# Patient Record
Sex: Female | Born: 1950 | Hispanic: No
Health system: Northeastern US, Academic
[De-identification: ages and names within clinical notes are randomized; demographics above are authoritative.]

---

## 2011-07-27 IMAGING — CR Chest PA and Left Lateral
2 series · 2 of 2 positions shown · non-contrast
Comparison: 09/30/2010

Final Report
TECHNIQUE: PA and lateral views of the chest are submitted.

[PA]
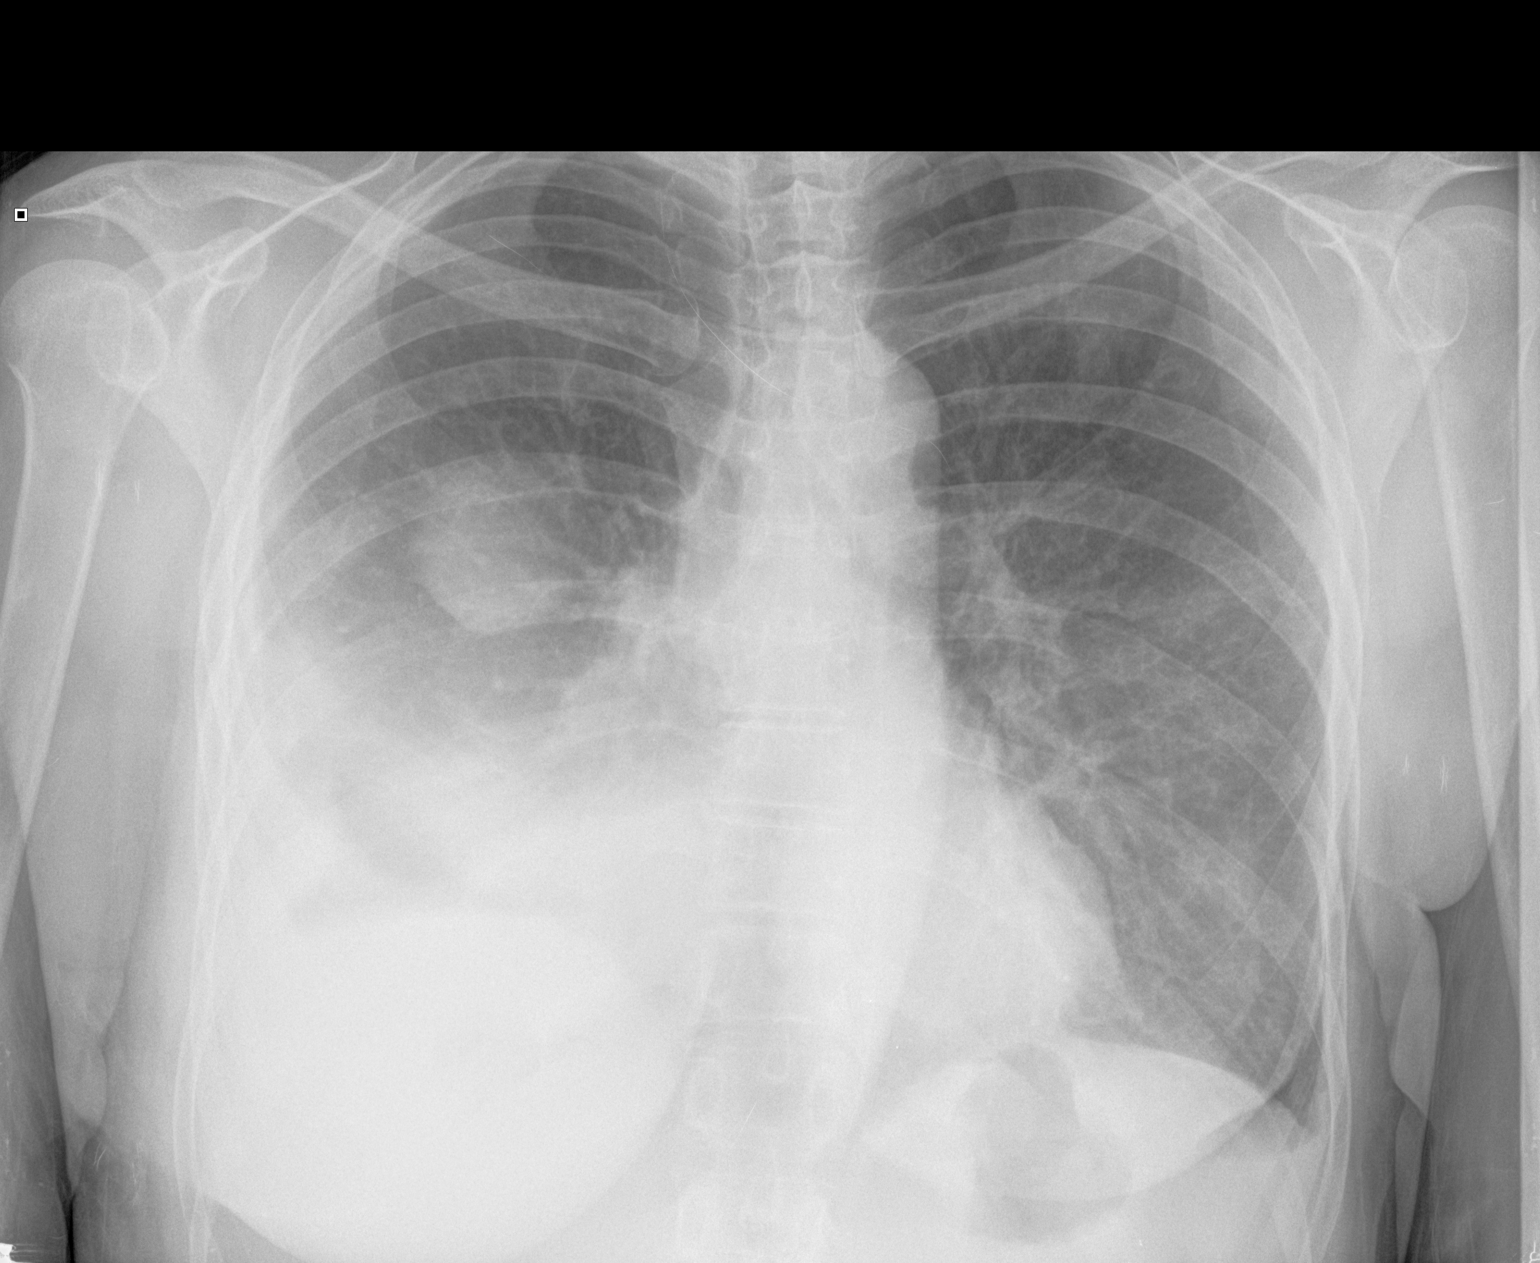

[left lateral]
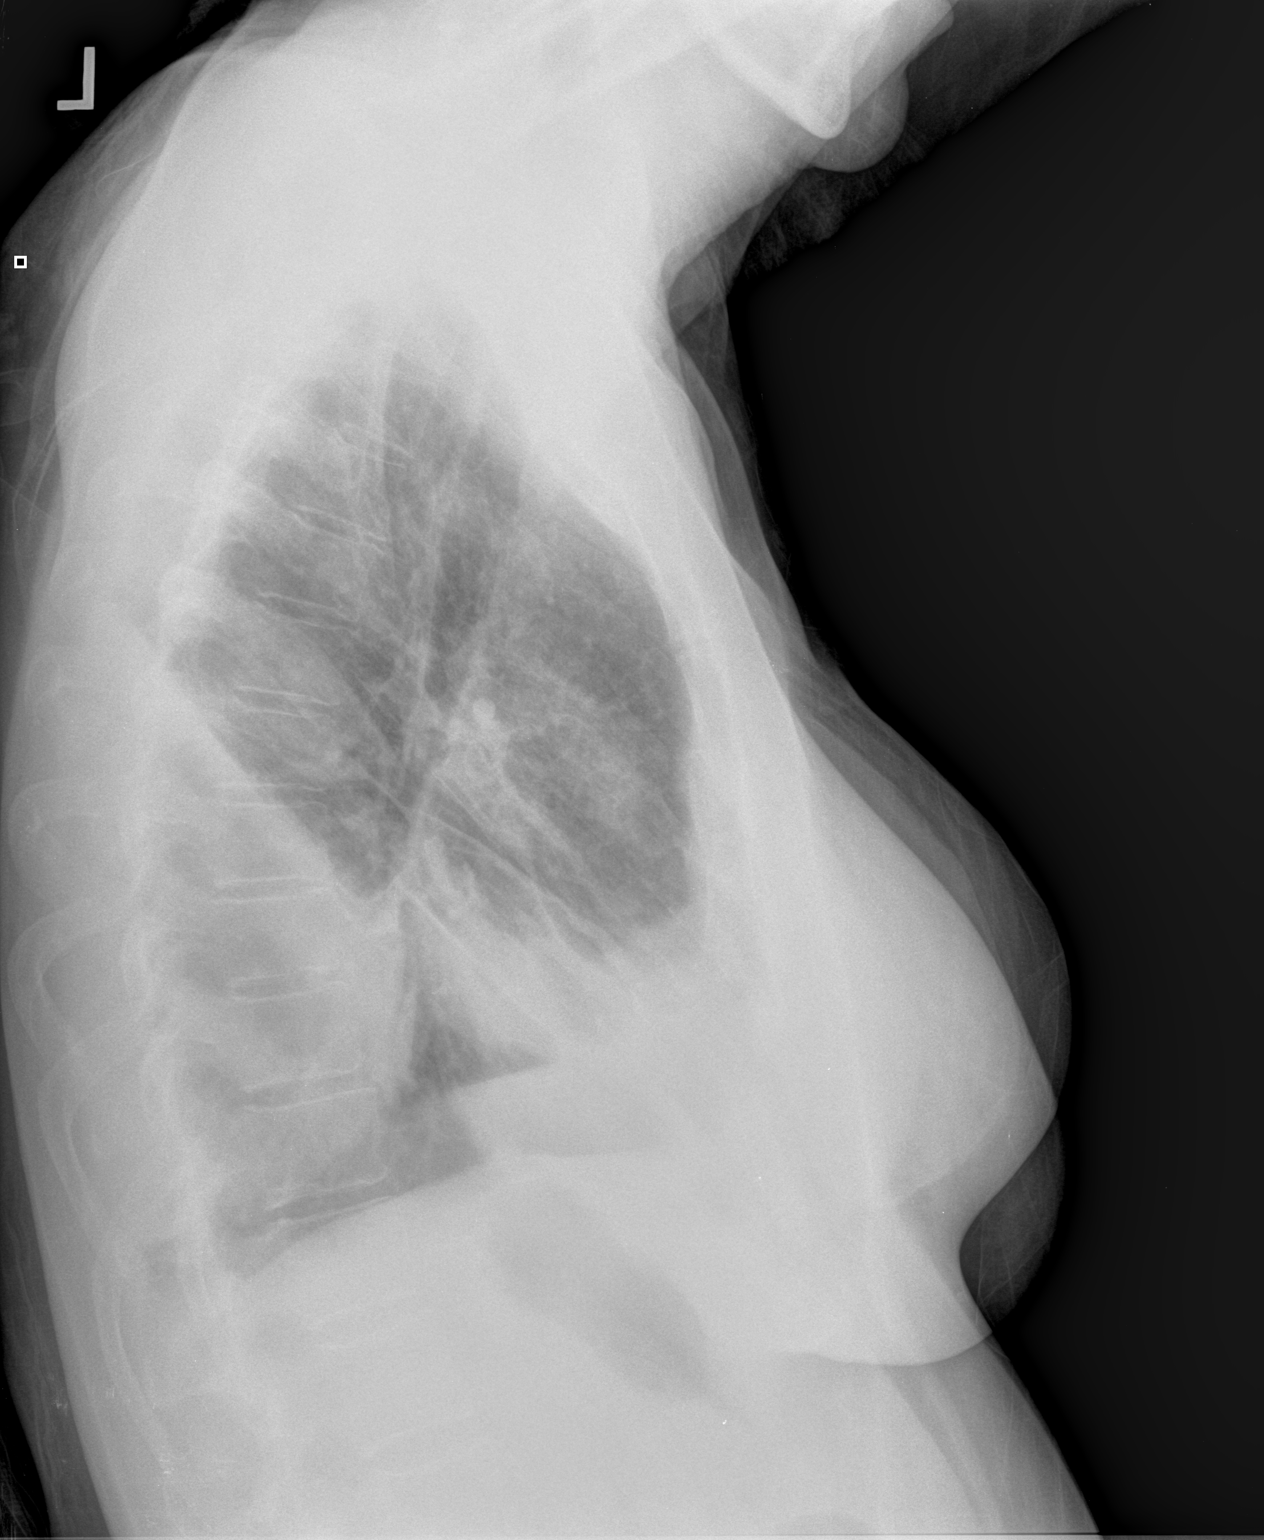

[2 of 2 positions shown; findings below may reference images not displayed]

FINDINGS: The heart is normal in size.  The mediastinum and hilar structures 

are unremarkable.  There is persistent moderate right pleural 

effusion.  There is decreased size of an apparent mass in the mid to 

upper right lung which has been demonstrated on CT examination to 

correspond to loculated fluid within the major fissure.  There is no 

evidence of left pleural effusion.  There is atelectasis at the right 

lung base.  There is no other consolidations seen elsewhere.  There 

is no pneumothorax.
IMPRESSION: Right pleural effusion.  Decreasing size of loculated pleural fluid 

collection the major fissure.  Right basal atelectasis.  No 

pneumothorax.

## 2011-07-28 IMAGING — CR Chest Single PA view
1 series · 1 of 1 positions shown · non-contrast
Comparison: INPUT

Final Report
HISTORY: Visit reason:  Post op;

[AP]
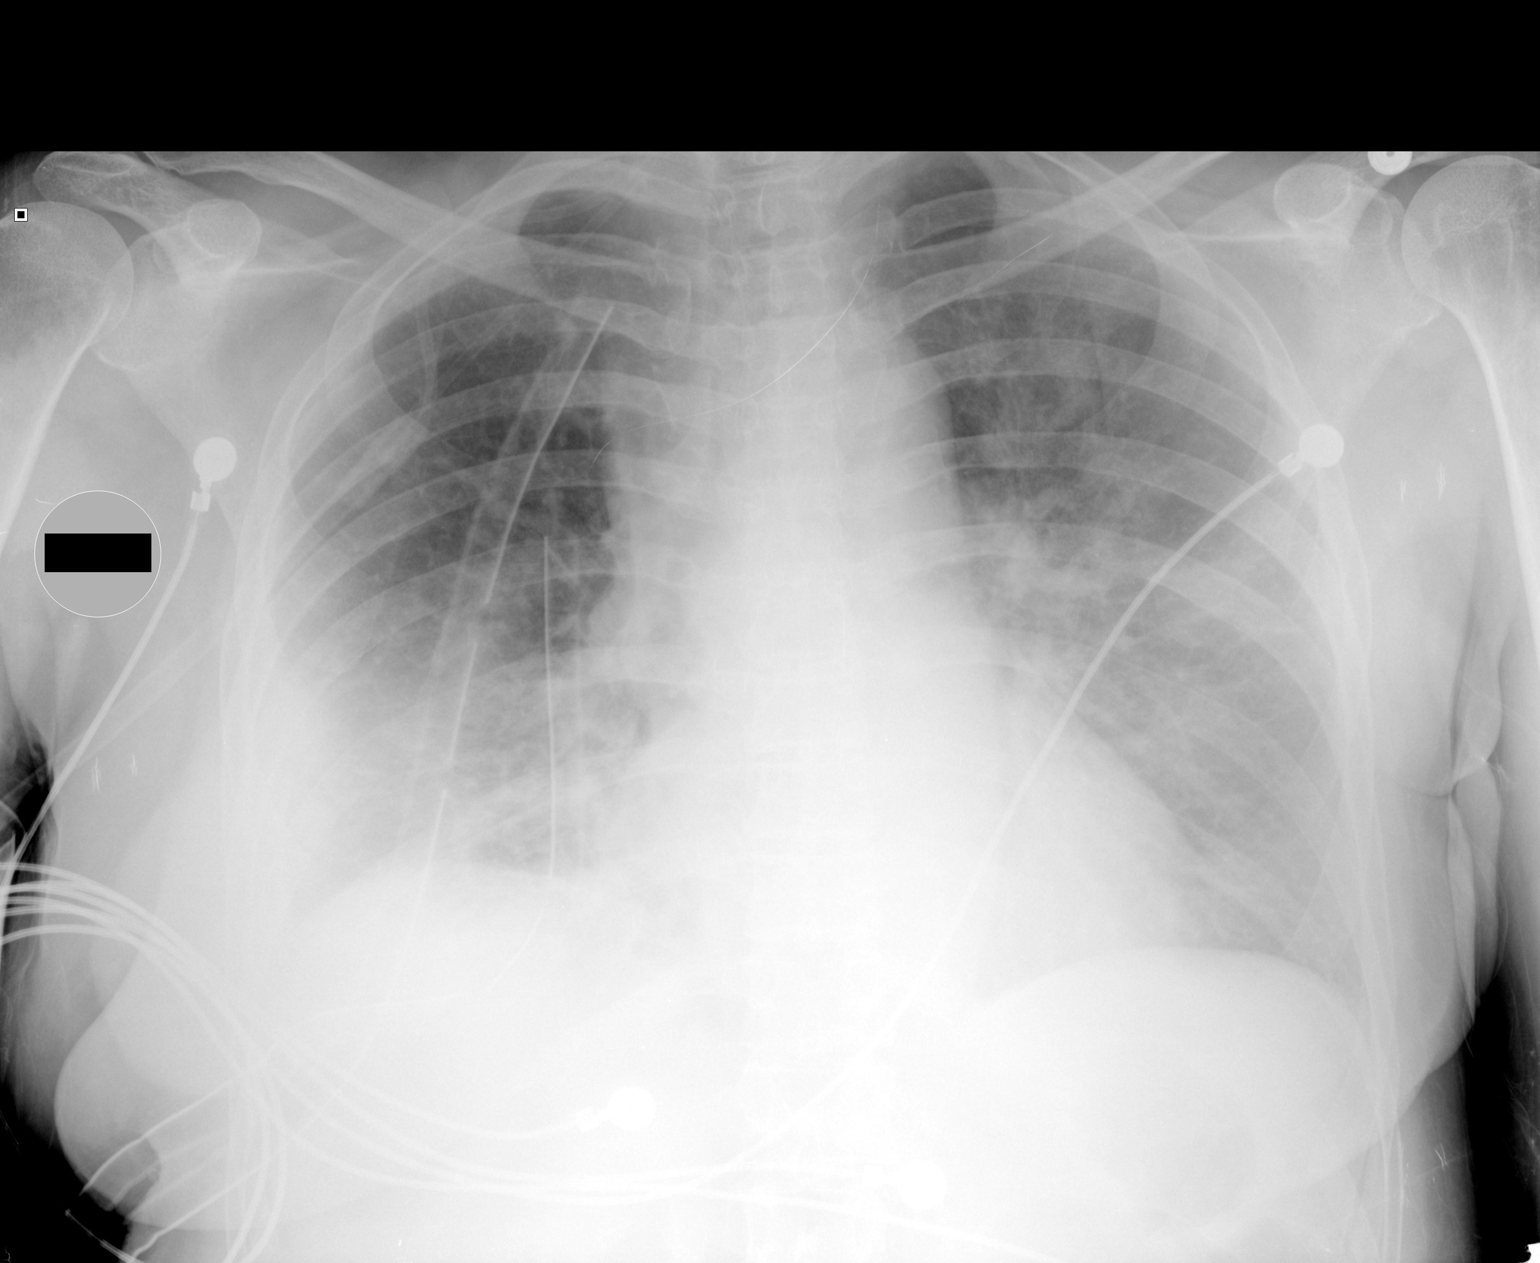

[1 of 1 positions shown; findings below may reference images not displayed]

There is haziness overlying the lower lung fields consistent pleural 

effusions which have increased since previous exam [DATE].  There are 

two right chest tubes.

There is no pneumothorax.  

There is focal atelectasis the right upper lobe.  This results in 

atelectasis in the right paramediastinal region.

Heart, hilum and mediastinal structures are unremarkable.  

Visualized osseous structures are without fracture.
IMPRESSION: Slight increase in alveolar edema.  Two right chest tubes.  No 

pneumothorax.

Unchanged right upper lobe atelectasis.

## 2021-04-01 ENCOUNTER — Encounter: Admit: 2021-04-01 | Payer: PRIVATE HEALTH INSURANCE | Attending: Adult Health | Primary: Internal Medicine

## 2021-04-01 DIAGNOSIS — Z01818 Encounter for other preprocedural examination: Secondary | ICD-10-CM

## 2021-04-08 ENCOUNTER — Encounter: Admit: 2021-04-08 | Payer: PRIVATE HEALTH INSURANCE | Attending: Surgery | Primary: Internal Medicine

## 2021-04-08 DIAGNOSIS — C539 Malignant neoplasm of cervix uteri, unspecified: Secondary | ICD-10-CM

## 2021-04-08 DIAGNOSIS — D490 Neoplasm of unspecified behavior of digestive system: Secondary | ICD-10-CM

## 2021-04-08 DIAGNOSIS — C50919 Malignant neoplasm of unspecified site of unspecified female breast: Secondary | ICD-10-CM

## 2021-04-08 DIAGNOSIS — E7800 Pure hypercholesterolemia, unspecified: Secondary | ICD-10-CM

## 2021-04-08 DIAGNOSIS — J45909 Unspecified asthma, uncomplicated: Secondary | ICD-10-CM

## 2021-04-08 DIAGNOSIS — I219 Acute myocardial infarction, unspecified: Secondary | ICD-10-CM

## 2021-04-08 DIAGNOSIS — K900 Celiac disease: Secondary | ICD-10-CM

## 2021-04-08 MED ORDER — CHOLECALCIFEROL (VITAMIN D3) 125 MCG (5,000 UNIT) TABLET
125 mcg (5,000 unit) | Freq: Every day | ORAL | Status: AC
Start: 2021-04-08 — End: ?

## 2021-04-08 MED ORDER — ATORVASTATIN 80 MG TABLET
80 mg | Freq: Every day | ORAL | Status: AC
Start: 2021-04-08 — End: ?

## 2021-04-08 MED ORDER — MELATONIN 10 MG TABLET
10 mg | Freq: Every evening | ORAL | Status: AC | PRN
Start: 2021-04-08 — End: ?

## 2021-04-08 MED ORDER — METOPROLOL SUCCINATE ER 25 MG TABLET,EXTENDED RELEASE 24 HR
25 mg | Freq: Every day | ORAL | Status: AC
Start: 2021-04-08 — End: ?

## 2021-04-08 MED ORDER — ASPIRIN 81 MG TABLET,DELAYED RELEASE
81 mg | Freq: Every day | ORAL | Status: AC
Start: 2021-04-08 — End: ?

## 2021-04-08 MED ORDER — ALBUTEROL INHL
RESPIRATORY_TRACT | Status: AC | PRN
Start: 2021-04-08 — End: ?

## 2021-04-08 NOTE — Other
Paula Arnold contacted via phone for rectal tumor excision with Dr Frederik Schmidt 04/15/2021.  Pre op labs and ekg obtained w/ pcp, Dr Johny Shears, 04/08/2021.  Health history obtained and documented.  Pmhx includes mild MI w/ 1 cardiac stent, left breast cancer (no arm precaution per Paula Arnold), cervical cancer, HLD.  Per Paula Arnold, her pcp cleared her for surgery (pcp was to confer with cardiologist).  Paula Arnold last seen by cardiologist 11/2020.  Anesthesia notified.  Pre op and post op teaching completed.  All questions answered.  Paula Arnold stated comprehension.  Paula Arnold's son, Paula Arnold, to accompany Paula Arnold.

## 2021-04-14 ENCOUNTER — Inpatient Hospital Stay: Admit: 2021-04-14 | Discharge: 2021-04-14 | Payer: MEDICARE | Primary: Internal Medicine

## 2021-04-14 DIAGNOSIS — Z20822 Contact with and (suspected) exposure to covid-19: Secondary | ICD-10-CM

## 2021-04-14 DIAGNOSIS — Z01812 Encounter for preprocedural laboratory examination: Secondary | ICD-10-CM

## 2021-04-14 DIAGNOSIS — Z01818 Encounter for other preprocedural examination: Secondary | ICD-10-CM

## 2021-04-14 LAB — COVID-19 CLEARANCE OR FOR PLACEMENT ONLY: BKR SARS-COV-2 RNA (COVID-19) (YH): NEGATIVE

## 2021-04-15 ENCOUNTER — Ambulatory Visit: Admit: 2021-04-15 | Payer: MEDICARE | Attending: Certified Registered" | Primary: Internal Medicine

## 2021-04-15 ENCOUNTER — Inpatient Hospital Stay: Admit: 2021-04-15 | Discharge: 2021-04-16 | Payer: MEDICARE | Attending: Surgery | Primary: Internal Medicine

## 2021-04-15 ENCOUNTER — Encounter: Admit: 2021-04-15 | Payer: PRIVATE HEALTH INSURANCE | Attending: Surgery | Primary: Internal Medicine

## 2021-04-15 DIAGNOSIS — C539 Malignant neoplasm of cervix uteri, unspecified: Secondary | ICD-10-CM

## 2021-04-15 DIAGNOSIS — D128 Benign neoplasm of rectum: Secondary | ICD-10-CM

## 2021-04-15 DIAGNOSIS — C50919 Malignant neoplasm of unspecified site of unspecified female breast: Secondary | ICD-10-CM

## 2021-04-15 DIAGNOSIS — E7800 Pure hypercholesterolemia, unspecified: Secondary | ICD-10-CM

## 2021-04-15 DIAGNOSIS — J45909 Unspecified asthma, uncomplicated: Secondary | ICD-10-CM

## 2021-04-15 DIAGNOSIS — D490 Neoplasm of unspecified behavior of digestive system: Secondary | ICD-10-CM

## 2021-04-15 DIAGNOSIS — K900 Celiac disease: Secondary | ICD-10-CM

## 2021-04-15 DIAGNOSIS — I219 Acute myocardial infarction, unspecified: Secondary | ICD-10-CM

## 2021-04-15 MED ORDER — HYDROMORPHONE 1 MG/ML INJECTION SYRINGE
1 mg/mL | Status: CP
Start: 2021-04-15 — End: ?

## 2021-04-15 MED ORDER — FENTANYL (PF) 50 MCG/ML INJECTION SOLUTION
50 mcg/mL | INTRAVENOUS | Status: DC | PRN
Start: 2021-04-15 — End: 2021-04-15

## 2021-04-15 MED ORDER — MIDAZOLAM 1 MG/ML INJECTION SOLUTION
1 mg/mL | Status: CP
Start: 2021-04-15 — End: ?

## 2021-04-15 MED ORDER — SURGICAL LUBRICANT JELLY TOPICAL
Status: DC | PRN
Start: 2021-04-15 — End: 2021-04-15
  Administered 2021-04-15: 18:00:00 via TOPICAL

## 2021-04-15 MED ORDER — BUPIVACAINE-EPINEPHRINE (PF) 0.25 %-1:200,000 INJECTION SOLUTION
0.25 %-1:200,000 | Status: DC | PRN
Start: 2021-04-15 — End: 2021-04-15
  Administered 2021-04-15: 18:00:00 0.25 %-1:200,000

## 2021-04-15 MED ORDER — MORPHINE 4 MG/ML INTRAVENOUS SOLUTION
4 mg/mL | INTRAVENOUS | Status: DC | PRN
Start: 2021-04-15 — End: 2021-04-15

## 2021-04-15 MED ORDER — PROPOFOL 10 MG/ML INTRAVENOUS EMULSION
10 mg/mL | INTRAVENOUS | Status: DC | PRN
Start: 2021-04-15 — End: 2021-04-15
  Administered 2021-04-15 (×2): 10 mg/mL via INTRAVENOUS

## 2021-04-15 MED ORDER — PROPOFOL 10 MG/ML INTRAVENOUS EMULSION
10 mg/mL | Status: CP
Start: 2021-04-15 — End: ?

## 2021-04-15 MED ORDER — ONDANSETRON HCL (PF) 4 MG/2 ML INJECTION SOLUTION
4 mg/2 mL | INTRAVENOUS | Status: DC | PRN
Start: 2021-04-15 — End: 2021-04-15

## 2021-04-15 MED ORDER — DEXAMETHASONE SODIUM PHOSPHATE 4 MG/ML INJECTION SOLUTION
4 mg/mL | Status: CP
Start: 2021-04-15 — End: ?

## 2021-04-15 MED ORDER — ONDANSETRON HCL (PF) 4 MG/2 ML INJECTION SOLUTION
4 mg/2 mL | INTRAVENOUS | Status: DC | PRN
Start: 2021-04-15 — End: 2021-04-15
  Administered 2021-04-15: 18:00:00 4 mg/2 mL via INTRAVENOUS

## 2021-04-15 MED ORDER — SODIUM CHLORIDE 0.9 % (FLUSH) INJECTION SYRINGE
0.9 % | Freq: Three times a day (TID) | INTRAVENOUS | Status: DC
Start: 2021-04-15 — End: 2021-04-15

## 2021-04-15 MED ORDER — CEFAZOLIN IV PUSH 1 GRAM VIAL & 0.9% SODIUM CHLORIDE (ADULT)
Freq: Once | INTRAVENOUS | Status: CP
Start: 2021-04-15 — End: ?
  Administered 2021-04-15: 18:00:00 20.000 mL via INTRAVENOUS

## 2021-04-15 MED ORDER — MEPERIDINE 50 MG/5 ML IN 0.9% SODIUM CHLORIDE
INTRAVENOUS | Status: DC | PRN
Start: 2021-04-15 — End: 2021-04-15

## 2021-04-15 MED ORDER — PHENYLEPHRINE 1 MG/10 ML (100 MCG/ML) IN 0.9 % SOD.CHLORIDE IV SYRINGE
1 mg/0 mL (00 mcg/mL) | Status: CP
Start: 2021-04-15 — End: ?

## 2021-04-15 MED ORDER — OXYCODONE IMMEDIATE RELEASE 5 MG TABLET
5 mg | ORAL | Status: DC | PRN
Start: 2021-04-15 — End: 2021-04-15
  Administered 2021-04-15: 19:00:00 5 mg via ORAL

## 2021-04-15 MED ORDER — DEXAMETHASONE SODIUM PHOSPHATE 4 MG/ML INJECTION SOLUTION
4 mg/mL | INTRAVENOUS | Status: DC | PRN
Start: 2021-04-15 — End: 2021-04-15
  Administered 2021-04-15: 18:00:00 4 mg/mL via INTRAVENOUS

## 2021-04-15 MED ORDER — HYDROMORPHONE 1 MG/ML INJECTION SYRINGE
1 mg/mL | INTRAVENOUS | Status: DC | PRN
Start: 2021-04-15 — End: 2021-04-15

## 2021-04-15 MED ORDER — METOPROLOL SUCCINATE ER 25 MG TABLET,EXTENDED RELEASE 24 HR
25 mg | Freq: Every day | ORAL | Status: DC
Start: 2021-04-15 — End: 2021-04-16

## 2021-04-15 MED ORDER — OXYCODONE IMMEDIATE RELEASE 5 MG TABLET
5 mg | Status: CP
Start: 2021-04-15 — End: ?

## 2021-04-15 MED ORDER — ONDANSETRON HCL (PF) 4 MG/2 ML INJECTION SOLUTION
4 mg/2 mL | Status: CP
Start: 2021-04-15 — End: ?

## 2021-04-15 MED ORDER — HALOPERIDOL LACTATE 5 MG/ML INJECTION SOLUTION
5 mg/mL | Freq: Once | INTRAVENOUS | Status: DC | PRN
Start: 2021-04-15 — End: 2021-04-15

## 2021-04-15 MED ORDER — ONDANSETRON HCL (PF) 4 MG/2 ML INJECTION SOLUTION
4 mg/2 mL | Freq: Four times a day (QID) | INTRAVENOUS | Status: DC | PRN
Start: 2021-04-15 — End: 2021-04-16

## 2021-04-15 MED ORDER — ACETAMINOPHEN 325 MG TABLET
325 mg | Freq: Four times a day (QID) | ORAL | Status: DC
Start: 2021-04-15 — End: 2021-04-16
  Administered 2021-04-16 (×3): 325 mg via ORAL

## 2021-04-15 MED ORDER — OXYCODONE IMMEDIATE RELEASE 10 MG TABLET
10 mg | ORAL | Status: DC | PRN
Start: 2021-04-15 — End: 2021-04-15

## 2021-04-15 MED ORDER — ONDANSETRON 4 MG DISINTEGRATING TABLET
4 mg | Freq: Four times a day (QID) | ORAL | Status: DC | PRN
Start: 2021-04-15 — End: 2021-04-16

## 2021-04-15 MED ORDER — LACTATED RINGERS INTRAVENOUS SOLUTION
INTRAVENOUS | Status: DC
Start: 2021-04-15 — End: 2021-04-15

## 2021-04-15 MED ORDER — POTASSIUM CHLORIDE 20 MEQ/L IN DEXTROSE 5 %-0.45 % SODIUM CHLORIDE IV
20 mEq/L | INTRAVENOUS | Status: DC
Start: 2021-04-15 — End: 2021-04-16
  Administered 2021-04-15 – 2021-04-16 (×2): 20 mL/h via INTRAVENOUS

## 2021-04-15 MED ORDER — METRONIDAZOLE 500 MG/100 ML IN SODIUM CHLOR(ISO) INTRAVENOUS PIGGYBACK
500 mg/100 mL | Freq: Once | INTRAVENOUS | Status: CP
Start: 2021-04-15 — End: ?
  Administered 2021-04-15: 18:00:00 500 mL via INTRAVENOUS

## 2021-04-15 MED ORDER — MIDAZOLAM (PF) 1 MG/ML INJECTION SOLUTION
1 mg/mL | INTRAVENOUS | Status: DC | PRN
Start: 2021-04-15 — End: 2021-04-15
  Administered 2021-04-15: 17:00:00 1 mg/mL via INTRAVENOUS

## 2021-04-15 MED ORDER — LIDOCAINE (PF) 20 MG/ML (2 %) INTRAVENOUS SOLUTION
20 mg/mL (2 %) | INTRAVENOUS | Status: DC | PRN
Start: 2021-04-15 — End: 2021-04-15
  Administered 2021-04-15: 17:00:00 20 mg/mL (2 %) via INTRAVENOUS

## 2021-04-15 MED ORDER — FENTANYL (PF) 50 MCG/ML INJECTION SOLUTION
50 mcg/mL | INTRAVENOUS | Status: DC | PRN
Start: 2021-04-15 — End: 2021-04-15
  Administered 2021-04-15: 17:00:00 50 mcg/mL via INTRAVENOUS

## 2021-04-15 MED ORDER — HYDROMORPHONE 0.5 MG/0.5 ML INJECTION SYRINGE
0.5 mg/ mL | INTRAVENOUS | Status: DC | PRN
Start: 2021-04-15 — End: 2021-04-15
  Administered 2021-04-15: 19:00:00 0.5 mL via INTRAVENOUS

## 2021-04-15 MED ORDER — MELATONIN 3 MG TABLET
3 mg | Freq: Every evening | ORAL | Status: DC | PRN
Start: 2021-04-15 — End: 2021-04-16

## 2021-04-15 MED ORDER — SODIUM CHLORIDE 0.9 % (FLUSH) INJECTION SYRINGE
0.9 % | INTRAVENOUS | Status: DC | PRN
Start: 2021-04-15 — End: 2021-04-15

## 2021-04-15 MED ORDER — FENTANYL (PF) 50 MCG/ML INJECTION SOLUTION
50 mcg/mL | Status: CP
Start: 2021-04-15 — End: ?

## 2021-04-15 MED ORDER — LACTATED RINGERS INTRAVENOUS SOLUTION
INTRAVENOUS | Status: DC
Start: 2021-04-15 — End: 2021-04-15
  Administered 2021-04-15: 17:00:00 1000.000 mL/h via INTRAVENOUS

## 2021-04-15 MED ORDER — LACTATED RINGERS INTRAVENOUS SOLUTION
INTRAVENOUS | Status: DC
Start: 2021-04-15 — End: 2021-04-15
  Administered 2021-04-15: 19:00:00 1000.000 mL/h via INTRAVENOUS

## 2021-04-15 MED ORDER — KETOROLAC 30 MG/ML (1 ML) INJECTION SOLUTION
30 mg/mL (1 mL) | Freq: Four times a day (QID) | INTRAVENOUS | Status: DC | PRN
Start: 2021-04-15 — End: 2021-04-16
  Administered 2021-04-16: 11:00:00 30 mL via INTRAVENOUS

## 2021-04-15 MED ORDER — ALBUTEROL SULFATE HFA 90 MCG/ACTUATION AEROSOL INHALER
90 mcg/actuation | Freq: Four times a day (QID) | RESPIRATORY_TRACT | Status: DC | PRN
Start: 2021-04-15 — End: 2021-04-16

## 2021-04-15 MED ORDER — PHENYLEPHRINE 1 MG/10 ML (100 MCG/ML) IN 0.9 % SOD.CHLORIDE IV SYRINGE
1 mg/0 mL (00 mcg/mL) | INTRAVENOUS | Status: DC | PRN
Start: 2021-04-15 — End: 2021-04-15
  Administered 2021-04-15 (×4): 1 mg/0 mL (00 mcg/mL) via INTRAVENOUS

## 2021-04-15 MED ORDER — ACETAMINOPHEN 500 MG TABLET
500 mg | Freq: Four times a day (QID) | ORAL | Status: DC | PRN
Start: 2021-04-15 — End: 2021-04-15

## 2021-04-15 NOTE — Other
Post Anesthesia Transfer of Care NotePatient: Paula WilliamsonProcedure(s) Performed: Procedure(s) (LRB):EXCISION OF RECTAL TUMOR, TRANSANAL APPROACH; INCLUDING MUSCULARIS PROPRIA (IE, FULL THICKNESS) 45172 (N/A) Patient location: PACU Last Vitals: Vitals Value Taken Time BP 117/68 04/15/21 1442 Temp 97.3 04/15/21 1442 Pulse 79 04/15/21 1442 Resp 16 04/15/21 1442 SpO2 97 04/15/21 1442 Level of consciousness: awake, alert  and orientedTransport Vital Signs:  Stable since the last set of recorded intra-operative vital signsIntra-operative Complications: noneIntra-operative Intake & Output and Antibiotics as per Anesthesia record and discussed with the RN.

## 2021-04-15 NOTE — Brief Op Note
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] Procedure(s) and Anesthesia Type:   * EXCISION OF RECTAL TUMOR, TRANSANAL APPROACH; INCLUDING MUSCULARIS PROPRIA (IE, FULL THICKNESS) 45172 - GENERALOperative Findings (enter relevant operative findings; do not refer to an operative report that is not yet transcribed): prolapsing flat soft 8 cm adenoma distal rectumSigns of infection present at the time of surgery at the operative site: None Blood and Blood Products: none                 Drains:  noneImplants: * No implants in log * Specimens: ID Type Source Tests Collected by Time 1 : Adenoma Tissue Rectum SURGICAL PATHOLOGY ([facility]) Darl Pikes, MD 04/15/2021  1:38 PM  Clinical Staging: 0EBL: Minimal       Post Operative Diagnosis: Benign rectal tumor []  Melina Modena, MD7/6/20222:45 PM

## 2021-04-15 NOTE — Anesthesia Post-Procedure Evaluation
Anesthesia Post-op NotePatient: Paula WilliamsonProcedure(s):  Procedure(s) (LRB):EXCISION OF RECTAL TUMOR, TRANSANAL APPROACH; INCLUDING MUSCULARIS PROPRIA (IE, FULL THICKNESS) 45172 (N/A) Patient location: PACULast Vitals:  I have noted the vital signs as listed in the nursing notes.Mental status recovered: patient participates in evaluation: YesVital signs reviewed: YesRespiratory function stable:YesAirway is patent: YesCardiovascular function and hydration status stable: YesPain control satisfactory: YesNausea and vomiting control satisfactory:Yes  No complications documented.

## 2021-04-15 NOTE — Other
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

## 2021-04-15 NOTE — Other
Patient/Family acknowledge understanding of fall prevention education including to call nurse with assistance with ambulation.

## 2021-04-15 NOTE — Utilization Review (ED)
UM Status: Meets Outpatient StatusPR EXCIS RECTAL TUMOR, TRANSANAL, FULL THICKNESS [45172Christine Quandre Polinski, RN BSNUtilization [phone]

## 2021-04-15 NOTE — Anesthesia Pre-Procedure Evaluation
This is a 70 y.o. female scheduled for EXCISION OF RECTAL TUMOR, TRANSANAL APPROACH; INCLUDING MUSCULARIS PROPRIA (IE, FULL THICKNESS) 45172 (N/A Anus).Review of Systems/ Medical HistoryPatient summary, nursing notes, EKG/Cardiac Studies , Labs, pre-procedure vitals, height, weight and NPO status reviewed.No previous anesthesia concernsAnesthesia Evaluation:   No history of anesthetic complications  Estimated body mass index is 21.3 kg/m? as calculated from the following:  Height as of this encounter: 5' 5 (1.651 m).  Weight as of this encounter: 58.1 kg. Past Surgical History:  S/p mastectomyCardiovascular:Patient has a history of: hypercholesterolemia. -Exercise tolerance: >4 METS -Coronary Artery Disease: MI Respiratory: -Airway disorders: -Asthma: yesGastrointestinal/Genitourinary: -Comments: Celiac dzPhysical ExamCardiovascular:    normal exam  Rhythm: regularPulmonary:  normal exam  Airway:  Mallampati: IIAnesthesia PlanASA 3 The primary anesthesia plan is  general. Anesthesia informed consent obtained. Anesthesia written consent obtainedConsent obtained from: patient  Consent Comment: Paper consent obtained to be scanned later.The post operative pain plan is per surgeon management.Plan discussed with CRNA.Anesthesiologist's Pre Op NoteI personally evaluated and examined the patient prior to the intra-operative phase of care.

## 2021-04-15 NOTE — Plan of Care
04/15/21 1439 04/15/21 1454 04/15/21 1539 Patient Observation Observation Received pt from OR . pt awake and alert . 4lnc applied . pt c/o some recal pain and cramping. rectum intact. no drainage noted .b/l scd's in place. tolerating ice chips medicated per EMAR report given to elvisa RN Plan of Care Overview/ Patient Status

## 2021-04-16 DIAGNOSIS — Z87891 Personal history of nicotine dependence: Secondary | ICD-10-CM

## 2021-04-16 DIAGNOSIS — Z79899 Other long term (current) drug therapy: Secondary | ICD-10-CM

## 2021-04-16 DIAGNOSIS — Z7982 Long term (current) use of aspirin: Secondary | ICD-10-CM

## 2021-04-16 DIAGNOSIS — C20 Malignant neoplasm of rectum: Secondary | ICD-10-CM

## 2021-04-16 DIAGNOSIS — I252 Old myocardial infarction: Secondary | ICD-10-CM

## 2021-04-16 DIAGNOSIS — E7800 Pure hypercholesterolemia, unspecified: Secondary | ICD-10-CM

## 2021-04-16 DIAGNOSIS — Z885 Allergy status to narcotic agent status: Secondary | ICD-10-CM

## 2021-04-16 DIAGNOSIS — J45909 Unspecified asthma, uncomplicated: Secondary | ICD-10-CM

## 2021-04-16 DIAGNOSIS — K900 Celiac disease: Secondary | ICD-10-CM

## 2021-04-16 NOTE — Utilization Review (ED)
UM Status: UR Reviewed-Continue Outpatient Services  POD1 transanal rectal tumor excision. VSS on RA. No acute events. Discharge order in.

## 2021-04-16 NOTE — Plan of Care
Plan of Care Overview/ Patient Status    1930: received patient and bedside report from [facility]. Patient is alert and oriented x4. Patient is upset due to no diet order for tonight, several calls/pages out to Dr Frederik Schmidt and service to clarify diet order. Lungs are clear, denies chest pain, SOB or cough. Abdomen is soft/nontender. +BS in 4 quads. Patient denies N/V or pain. OOB ambulating in room with steady gait. Voiding without issue. No BM. Call bell in reachUnable to update careplan due to IT issue. 2030: sup. Aurther Loft made aware of no call back from dr Frederik Schmidt, patient is upset. Sup to reach out to Dr Frederik Schmidt, PA [facility] also made aware, she will reach out to Dr Frederik Schmidt. Patient was provided ice chips/sips h20 for meds. 2300: No response from Dr Frederik Schmidt or service. Patient sleeping at this time. 0000: patient ambulates to BR independently. Patient reports small amt rectal bleeding. Asked patient to save tissue for monitoring. Patient tol juice, ice cream. No N/V. 0200: patient sleeping, resp 18, unlabored. 0500: patient awake and offers no complaints. Peripad provided to monitor rectal bleed. 0720: patient c/o pain in rectum. Rates pain 6/10 on numeric pain scale. Med with toradol iv.

## 2021-04-16 NOTE — Plan of Care
Plan of Care Overview/ Patient Status    15:58 Received pt from PACU, alert and oriented x4. VSS. Denies pain and nausea. No rectal bleeding noted. B/L SCD. Oriented pt to room. Call bell within reach. Bed alarm in place. Pt OOB stand by assist, voiding without difficulties. Denies dizziness.

## 2021-04-16 NOTE — Plan of Care
Plan of Care Overview/ Patient Status    Bedside report given by outgoing RN. Patient awake in bed. Alert and oriented x4. Call bell within reach. Safety maintained.0915:VSS except BP which was low. Patient symptomatic. Metoprolol held due to low BP.Tylenol given. Lung sounds clear to auscultation.  Abdomen soft and non tender. Bowel sounds in all 4 quadrants.+ Flatus. Patient report of last BM yesterday. Voiding with no difficulties. Small amount of rectal bleeding. Denies pain. IV site CDI. Patient currently NPO and inquiring about when she'll be able to eat. 1000: Patient placed on low fiber diet and given breakfast. Tolerated well. Fellow RN spoke with MD. Informed of low BP. Diet adjusted. Okay'd to go home if tolerated well.1100: Discharge instructions discussed with patient. Patient concerned about high fiber diet since she eats gluten free. Patient encouraged to eat whatever she can that's high in fiber. Patient verbalized understanding. Hep lock d/c'd. 1130: Patient escorted by writer outside into car driven by son. Refused wheelchair.Stable condition.

## 2021-04-16 NOTE — Discharge Instructions
Go directly home after discharge from the hospital, and relax the rest of the dayNo driving or strenuous activities todayEat light foods until your appetite returns and you feel normal, then eat a diet high in fiber.Drink 6-10 glasses of a non-carbonated, non-alcoholic beverage ( water is best ) a dayPain medication: .You may use over the counter analgesics such as Aleve, Advil, Motrin as directedTylenol may be taken throughout the recovery in addition to any of the above medications.Nupercainal ointment to the anal area is helpful as a topical anesthetic.The first few times you have a BM might be painful.Therefore, prevention of constipation and hard stools is important.Use a stool softener ( colace 100mg  two per day or Senna-S 2 per day) Take a laxative ( 1 oz milk of magnesia, or prune juice ) if unable to BM by day 2Some bleeding from the rectal area is expected until fully healed. Keep the site covered with a gauze pad until it stops.Sitz baths daily as needed or Warm tub baths with Epsom salts any time is fine.Expect swelling of the area as a normal part of the healing process. Give the body time to healThere may be occasional urinary difficulty at first. If unable to urinate once you are home, try sitting in a warm bath and relieve the bladder in the tub. If this does not work, contact my office to discuss whther you need a catheter.Wash the site with [place] or some other plain white soap and warm water.You may apply bacitracin, vaseline, or A and D ointment to the anal areaWhen you get home call office [phone] for a Post Op  appointment in 4 weeks post surgeryPlease feel free to call me with questions or concerns.

## 2021-04-16 NOTE — Plan of Care
Plan of Care Overview/ Patient Status    Problem: Adult Inpatient Plan of CareGoal: Readiness for Transition of CareOutcome: Interventions implemented as appropriate Initial CM  Screening completed with negative for CM Evaluation and Planning outcome . Pt is admit as elective surgery for Transanal excision, full-thickness rectal adenoma. No post acute discharge needs identified at this time.   CM will continue to follow and assist with pt's disposition as needed.Case Management Screening  Flowsheet Row Most Recent Value Case Management Screening: Chart review completed. If YES to any question below then proceed to CM Eval/Plan  Is there a change in their cognitive function No Do you anticipate a change in this patient's physicial function that will effect discharge needs? No Has there been a readmission within the last 30 days No Were there services prior to admission ( Examples: Assisted Living, HD, Homecare, Extended Care Facility, Methadone, SNF, Outpatient Infusion Center) No Negative/Positive Screen Negative Screening: Case Management department will follow patient's progress and discuss plan of care with treatment team. Case Manager Attestation  I have reviewed the medical record and completed the above screen. CM staff will follow patient's progress and discuss the plan of care with the Treatment Team. Yes  Lajuana Carry. Maple Mirza RN CCM [facility] ED Nurse Case Stephens  Hospital Utilization Specialist [facility]  [place],  [place] [phone] : [phone]

## 2021-04-23 NOTE — Other
[facility] REPORTPat Name:  Paula Arnold, SHUEY  [facility] No:  [id number] D.O.B.:  [birth year]Dict MD:  Melina Modena, M.D.Attend MD:  Melina Modena, M.D.Referring MD:   Adm/Ser DT:  07/06/2022Disch DT:   CSN:  [id number] Pat Locat:  [facility] [id number] DATE:  07/06/2022SURGEON:  Melina Modena, M.D.ANESTHESIOLOGIST:Jillian Versie Starks, M.D.PREOPERATIVE DIAGNOSIS:Prolapsing rectal adenoma.POSTOP DIAGNOSES:1.	Prolapsing rectal adenoma. 2.	Final pathology pending.OPERATION PERFORMED:Transanal excision, full-thickness rectal adenoma.ANESTHESIA:General.EBL:Minimal.COMPLICATIONS:None.BLOOD LOSS:Negligible.COUNTS:Correct x2.SPECIMEN:8 cm flat adenoma carpeting the distal rectum with prolapse.HISTORY:The patient is 70 years old, recently underwent a colonoscopy during which a very large rectal polyp was identified.  The polyp was noted to be prolapsed while the patient coughed, and was estimated to be approximately 8 cm.  She was referred for surgical removal.  The biopsy performed at the time was negative for invasive carcinoma.  The patient was apprised of the risks, benefits, alternatives, and potential complications of this operation.  She understands that if there is invasive carcinoma, additional treatment would most likely be required.  As such, she was advised to undergo complete excision of the lesion, upon which any future treatment will be based.DESCRIPTION OF PROCEDURE:The patient was brought to the OR in good condition.  She was intubated and positioned in lithotomy.  A prep with Betadine was performed.  Drapes were applied and a time-out was done.  Local anesthesia was infiltrated into the anal sphincter, and a gentle anal dilation was performed digitally.  The polyp was easily palpated in the posterior quadrant.  It was easily prolapsed, and normal mucosa was able to be seen distally beyond the anal canal.  With a retractor in place, the polyp was grasped gently with a sponge and held in the prolapsed position.  A Bovie was used to score its periphery through normal mucosa proximally, laterally, and up to the dentate line distally.  The lesion was removed using electrofulguration.  The specimen was then oriented and pinned on a towel for the pathologist to evaluate. The mucosal defect was repaired using layered 2-0 chromic suture.  First, interrupted mattress sutures were placed.  This was oversewn with a running 2-0 chromic suture.  Digital rectal exam was satisfactory.  The lumen was widely patent.  A large anoscope was inserted, inspecting the suture line, which was intact, and irrigation of the wound site demonstrated no bleeding.  The operation concluded after a correct sponge, needle, and instrument count.  The patient tolerated surgery well.  She was awakened and transported to the PACU.Job #:  [id number] D:  04/15/2021 14:51 T:  04/15/2021 [id number] MEDQ  **Information in this report should not be considered final until authenticated by the author and should not be re-disclosed.**
